# Patient Record
Sex: Male | Born: 1940 | Race: White | Hispanic: No | Marital: Married
Health system: Southern US, Community
[De-identification: ages and names within clinical notes are randomized; demographics above are authoritative.]

---

## 2004-08-01 ENCOUNTER — Emergency Department: Payer: Self-pay | Admitting: Emergency Medicine

## 2005-02-10 ENCOUNTER — Ambulatory Visit: Payer: Self-pay | Admitting: Gastroenterology

## 2007-03-14 ENCOUNTER — Emergency Department: Payer: Self-pay | Admitting: Emergency Medicine

## 2007-03-20 ENCOUNTER — Ambulatory Visit: Payer: Self-pay | Admitting: Orthopaedic Surgery

## 2007-03-20 ENCOUNTER — Other Ambulatory Visit: Payer: Self-pay

## 2011-08-24 ENCOUNTER — Emergency Department: Payer: Self-pay | Admitting: Unknown Physician Specialty

## 2012-09-04 LAB — CBC WITH DIFFERENTIAL/PLATELET
Basophil #: 0 10*3/uL (ref 0.0–0.1)
Basophil %: 0.3 %
Eosinophil #: 0 10*3/uL (ref 0.0–0.7)
Eosinophil %: 0.2 %
HCT: 25.5 % — ABNORMAL LOW (ref 40.0–52.0)
HGB: 8.7 g/dL — ABNORMAL LOW (ref 13.0–18.0)
Lymphocyte #: 0.2 10*3/uL — ABNORMAL LOW (ref 1.0–3.6)
Lymphocyte %: 3.2 %
MCH: 30.7 pg (ref 26.0–34.0)
MCV: 90 fL (ref 80–100)
Neutrophil #: 6.5 10*3/uL (ref 1.4–6.5)
Neutrophil %: 88.8 %
Platelet: 141 10*3/uL — ABNORMAL LOW (ref 150–440)
RBC: 2.83 10*6/uL — ABNORMAL LOW (ref 4.40–5.90)
RDW: 18 % — ABNORMAL HIGH (ref 11.5–14.5)

## 2012-09-04 LAB — COMPREHENSIVE METABOLIC PANEL
Albumin: 3.1 g/dL — ABNORMAL LOW (ref 3.4–5.0)
Alkaline Phosphatase: 128 U/L (ref 50–136)
Anion Gap: 9 (ref 7–16)
BUN: 21 mg/dL — ABNORMAL HIGH (ref 7–18)
Bilirubin,Total: 0.9 mg/dL (ref 0.2–1.0)
Calcium, Total: 9.1 mg/dL (ref 8.5–10.1)
Chloride: 107 mmol/L (ref 98–107)
Creatinine: 0.9 mg/dL (ref 0.60–1.30)
EGFR (African American): >60
EGFR (Non-African Amer.): >60
Potassium: 3.4 mmol/L — ABNORMAL LOW (ref 3.5–5.1)
SGPT (ALT): 27 U/L (ref 12–78)

## 2012-09-04 LAB — PROTIME-INR: INR: 1

## 2012-09-04 LAB — TROPONIN I: Troponin-I: <0.02 ng/mL

## 2012-09-05 ENCOUNTER — Inpatient Hospital Stay: Payer: Self-pay | Admitting: Internal Medicine

## 2012-09-05 LAB — URINALYSIS, COMPLETE
Blood: NEGATIVE
Glucose,UR: NEGATIVE mg/dL (ref 0–75)
Leukocyte Esterase: NEGATIVE
Nitrite: POSITIVE
Ph: 5 (ref 4.5–8.0)
Protein: NEGATIVE
RBC,UR: 2 /HPF (ref 0–5)
Specific Gravity: 1.02 (ref 1.003–1.030)
Squamous Epithelial: <1
WBC UR: 9 /HPF (ref 0–5)

## 2012-09-06 LAB — CBC WITH DIFFERENTIAL/PLATELET
Basophil #: 0 10*3/uL (ref 0.0–0.1)
Eosinophil %: 0.1 %
HCT: 19.5 % — ABNORMAL LOW (ref 40.0–52.0)
HGB: 6.9 g/dL — ABNORMAL LOW (ref 13.0–18.0)
Lymphocyte #: 0.4 10*3/uL — ABNORMAL LOW (ref 1.0–3.6)
MCH: 31.6 pg (ref 26.0–34.0)
Monocyte #: 0.6 x10 3/mm (ref 0.2–1.0)
Monocyte %: 10.8 %
Neutrophil %: 80.6 %
Platelet: 113 10*3/uL — ABNORMAL LOW (ref 150–440)
RDW: 18.2 % — ABNORMAL HIGH (ref 11.5–14.5)
WBC: 5.2 10*3/uL (ref 3.8–10.6)

## 2012-09-06 LAB — BASIC METABOLIC PANEL
Chloride: 107 mmol/L (ref 98–107)
Co2: 22 mmol/L (ref 21–32)
EGFR (African American): 54 — ABNORMAL LOW
EGFR (Non-African Amer.): 47 — ABNORMAL LOW
Glucose: 104 mg/dL — ABNORMAL HIGH (ref 65–99)
Potassium: 3.8 mmol/L (ref 3.5–5.1)
Sodium: 138 mmol/L (ref 136–145)

## 2012-09-07 LAB — CBC WITH DIFFERENTIAL/PLATELET
Comment - H1-Com2: NORMAL
HCT: 21.4 % — ABNORMAL LOW (ref 40.0–52.0)
HGB: 7.2 g/dL — ABNORMAL LOW (ref 13.0–18.0)
MCH: 30 pg (ref 26.0–34.0)
MCHC: 33.9 g/dL (ref 32.0–36.0)
MCV: 88 fL (ref 80–100)
Metamyelocyte: 1 %
Monocytes: 7 %
Myelocyte: 1 %
Segmented Neutrophils: 78 %

## 2012-09-07 LAB — BASIC METABOLIC PANEL
BUN: 23 mg/dL — ABNORMAL HIGH (ref 7–18)
Calcium, Total: 7.9 mg/dL — ABNORMAL LOW (ref 8.5–10.1)
Creatinine: 1.34 mg/dL — ABNORMAL HIGH (ref 0.60–1.30)
Osmolality: 280 (ref 275–301)
Potassium: 3.8 mmol/L (ref 3.5–5.1)
Sodium: 138 mmol/L (ref 136–145)

## 2012-09-08 LAB — CBC WITH DIFFERENTIAL/PLATELET
Comment - H1-Com2: NORMAL
HCT: 19.8 % — ABNORMAL LOW (ref 40.0–52.0)
Lymphocytes: 14 %
MCV: 89 fL (ref 80–100)
RBC: 2.24 10*6/uL — ABNORMAL LOW (ref 4.40–5.90)
RDW: 18.1 % — ABNORMAL HIGH (ref 11.5–14.5)
Segmented Neutrophils: 73 %
WBC: 4.6 10*3/uL (ref 3.8–10.6)

## 2012-09-08 LAB — BASIC METABOLIC PANEL
BUN: 19 mg/dL — ABNORMAL HIGH (ref 7–18)
Calcium, Total: 7.8 mg/dL — ABNORMAL LOW (ref 8.5–10.1)
Co2: 23 mmol/L (ref 21–32)
Creatinine: 1.08 mg/dL (ref 0.60–1.30)
EGFR (African American): >60
EGFR (Non-African Amer.): >60
Glucose: 99 mg/dL (ref 65–99)
Osmolality: 276 (ref 275–301)
Potassium: 4 mmol/L (ref 3.5–5.1)

## 2012-09-08 LAB — VANCOMYCIN, TROUGH: Vancomycin, Trough: 15 ug/mL (ref 10–20)

## 2013-03-10 DEATH — deceased

## 2014-05-02 NOTE — Consult Note (Signed)
PATIENT NAME:  Steve Shaw, Steve Shaw MR#:  [id number] DATE OF BIRTH:  May 01, 1940  DATE OF CONSULTATION:  09/05/2012  REFERRING PHYSICIAN: Einar CrowMarshall Anderson, MD  CONSULTING PHYSICIAN:  Leitha SchullerMichael J. Gladys Gutman, MD  REASON FOR CONSULTATION: Severe right hip pain.   HISTORY OF PRESENT ILLNESS: The patient is a 74 year old with known metastatic prostate CA, who had fairly rapid onset of hip pain in the right hip. He had significant fever and there was concern about possible septic joint. On examination, he is found to be somewhat tender over the greater trochanter and proximal to it. He did not have pain with leg log rolling. He is unable to bear weight on initial exam. He is neurovascularly intact. He had been continuing to work up until the onset of this. 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 On MRI, there was also a hematoma in the gluteal muscle. This appears to be the cause of his pain. With absence of pain with logrolling, I doubt there is any intra-articular pathology other than his metastatic disease, which does not seem to be giving him and his pain with the pain being more proximal.   IMPRESSION: Soft tissue injury right gluteal musculature. Recommend physical therapy to work on weight-bearing as tolerated.  ____________________________ Leitha SchullerMichael J. Shaterrica Territo, MD mjm:aw D: 09/06/2012 07:33:00 ET T: 09/06/2012 07:45:15 ET JOB#: [id number]  cc: Leitha SchullerMichael J. Makyiah Lie, MD, <Dictator> Leitha SchullerMICHAEL J Nikyla Navedo MD ELECTRONICALLY SIGNED 09/06/2012 9:11

## 2014-05-02 NOTE — Consult Note (Signed)
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   Patient was seen by consultant.   Comments: recommend PT, WBAT on right modalities to right hip.  Electronic Signatures: Leitha SchullerMenz, Lavora Brisbon J (MD)  (Signed 27-Aug-14 12:50)  Authored: Brief Consult Note   Last Updated: 27-Aug-14 12:50 by Leitha SchullerMenz, Manuella Blackson J (MD)

## 2014-05-02 NOTE — Discharge Summary (Signed)
PATIENT NAME:  Steve Shaw, Steve Shaw MR#:  [id number] DATE OF BIRTH:  1940/10/18  DATE OF ADMISSION:  09/05/2012 DATE OF DISCHARGE:  09/06/2012  DISCHARGE DIAGNOSES: 1.  Sepsis.  2.  Metastatic prostate cancer.  3.  Anemia, likely from chemotherapy.  CURRENT MEDICATIONS: That might have to be changed at transfer center are: 1.  Tylenol p.r.n.  2.  Lovenox 95 mg q. 12 hours, which has been chronic for his pulmonary emboli, [facility]. 3.  Levofloxacin 750 mg IV q. 24 hours. 4.  Morphine 2 to 4 mg q. 4 hours p.r.n. pain. 5.  Zofran 4 mg p.o. q. 6 hours p.r.n. nausea and vomiting. 6.  Ranitidine 150 mg p.o. q. 12 hours. 7.  Vancomycin 1000 mg q. 12 hours. Apt to change based on levels.  HOME MEDICATIONS: That were held at admission include: Naprosyn p.r.n., prednisone and dexamethasone which have been used as part of his chemotherapy regimen, Zyrtec 10 mg daily, calcium and vitamin D daily, atorvastatin 10 mg daily.   HISTORY AND PHYSICAL: Please see detailed history and physical done on admission.   HOSPITAL COURSE: The patient was admitted with pain, weakness and fever of 103. Fever mostly abated on the antibiotics as noted above, although he was 97.7 this morning, 98 levels otherwise. Cultures are still negative today including blood cultures. Urinalysis had some pyuria, but a mild amount. Culture is pending. Further work-up of his hip pain included a MRI of his hip, which was significant for right gluteal muscle fluid collection, which is felt to be most consistent with a hematoma given his heterogeneous T1 and T2 signal noise. Did have a hematoma visually superficially on the right hip area as well. He had signal abnormality throughout the lower lumbar spine, sacrum, ilium and bilateral proximal femurs most consistent with metastatic disease. Hemoglobin on admission was 8.7, but after a day it was down to 6.9. He will likely need transfusion for that. Will hold off on ordering that until we  can get a better time frame and answer on potential transfer. His other labs showed a creatinine of 1.5 today, WBC of 5.2 with a hemoglobin of 6.9 and platelet count of 113.  ____________________________ Marya AmslerMarshall W. Dareen PianoAnderson, MD mwa:sb D: 09/06/2012 08:13:11 ET T: 09/06/2012 08:28:36 ET JOB#: [id number]  cc: Marya AmslerMarshall W. Dareen PianoAnderson, MD, <Dictator> Lauro RegulusMARSHALL W Matvey Llanas MD ELECTRONICALLY SIGNED 09/07/2012 7:56

## 2014-05-02 NOTE — Discharge Summary (Signed)
PATIENT NAME:  Steve FredericksonWALSTON, Dolton MR#:  [id number] DATE OF BIRTH:  1940/09/05  DATE OF ADMISSION:  09/05/2012 DATE OF DISCHARGE:  09/07/2012   Addendum  ADDENDUM TO HOSPITAL COURSE: The patient remained stable overnight. Hemoglobin came up to 7.2 after transfusion. However, this morning he is complaining of weaker legs with some numb sensation as well. 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 Will go ahead and order an MRI of his L-spine prior [facility], if this can be done. Please see report of MRI for details if that is done. The patient understands this and understands [facility] is [facility]. He will be transferred to where his treating oncologists are at this point. He is still on his antibiotics and remains afebrile. Urine cultures have gram-negative rods of just 1000 colonies/mL. His temperature has remained under 100 at this point as well, so thought to discontinuing his vancomycin and levofloxacin will be given in the next 24 hours either here or at [facility].   ____________________________ Marya AmslerMarshall W. Dareen PianoAnderson, MD mwa:OSi D: 09/07/2012 08:08:54 ET T: 09/07/2012 08:32:33 ET JOB#: [id number]  cc: Marya AmslerMarshall W. Dareen PianoAnderson, MD, <Dictator> Lauro RegulusMARSHALL W Shanie Mauzy MD ELECTRONICALLY SIGNED 09/07/2012 13:46

## 2014-05-02 NOTE — H&P (Signed)
PATIENT NAME:  Steve Shaw, Steve Shaw MR#:  [id number] DATE OF BIRTH:  1940/04/02  DATE OF ADMISSION:  09/05/2012  PRIMARY CARE PHYSICIAN:  Dr. Dareen Piano.  REFERRING PHYSICIAN:  Dr. Fanny Bien.  CHIEF COMPLAINT:  Fever and right hip pain.   HISTORY OF PRESENT ILLNESS:  The patient is a 74 year old Caucasian male with past medical history of metastatic prostate cancer with mets to right hip as per recent PET scan, on chemotherapy, last chemo on August 11th, is presenting to the ER with a chief complaint of fever of 103 degrees Fahrenheit associated with weakness and severe right hip pain and unable to ambulate.  The patient denies any chest pain or shortness of breath.  He gets his chemotherapies at [facility].  The patient also has recent history of 2 pulmonary embolism and DVT and gets Lovenox 1 mg/kg subQ q. 12 hours.  Initially the patient was hypotensive.  After giving fluid boluses his blood pressure went up to 102/57.  Chest x-ray has revealed a possible developing pneumonia on the right lower lobe.  Blood cultures were obtained and Zosyn and vancomycin were given empirically.  Hospitalist team is called to admit the patient for sepsis.  During my examination, the patient denies any chest pain or shortness of breath.  The pain medication has eased off his right hip pain.  Wife and sister-in-law at bedside.   PAST MEDICAL HISTORY:  Metastatic prostate cancer, hyperlipidemia, chronic sinusitis.   PAST SURGICAL HISTORY:  Left knee cap surgery, prostatectomy, hernia repair.   ALLERGIES:  The patient has no known drug allergies.   PSYCHOSOCIAL HISTORY:  Lives at home with wife.  Denies smoking.  Occasional intake of alcohol.  Denies drugs.   FAMILY HISTORY:  Both parents were healthy.  Brother had a history of prostate cancer.   HOME MEDICATIONS:  Tylenol Arthritis 1 tablet once a day, Lipitor 10 mg once daily, ibuprofen 800 mg 3 times a day, Claritin 10 mg 2 times a day, aspirin 81 mg once daily, Tylenol 1 tablet  by mouth q. 8 hours as needed.   REVIEW OF SYSTEMS: CONSTITUTIONAL:  Denies any weight loss, but complaining of fever and fatigue.  EYES:  No blurry vision, glaucoma.  EARS, NOSE, THROAT:  No epistaxis or discharge.  RESPIRATION:  Denies cough, COPD.  CARDIOVASCULAR:  No chest pain or palpitations.  GASTROINTESTINAL:  Denies nausea, vomiting, diarrhea.  GENITOURINARY:  No dysuria, hematuria.  Had a history of hernia repair.  Had a history of metastatic prostate cancer with mets to right hip.   ENDOCRINE:  Denies polyuria, nocturia, thyroid problems. HEMATOLOGIC:  Denies anemia, easy bruising or bleeding, but had a recent history of PE and DVT.  MUSCULOSKELETAL:  Complaining of right hip pain, was able to ambulate for the past two weeks, but unable to ambulate for the past one day.  NEUROLOGIC:  No vertigo, ataxia.   PSYCHIATRIC:  No ADD, OCD.   PHYSICAL EXAMINATION: VITAL SIGNS:  Temperature 99 degrees Fahrenheit, pulse 104, respirations 18, blood pressure 102/57, pulse ox 97% on 2 liters.  GENERAL APPEARANCE:  Not under acute distress.  Moderately built and nourished.  HEENT:  Normocephalic, atraumatic.  Pupils are equally reacting to light and accommodation.  No scleral icterus.  No conjunctival injection.  No sinus tenderness.  No postnasal drip.  NECK:  Supple.  No JVD.  No thyromegaly.  Range of motion is intact.  LUNGS:  Clear to auscultation bilaterally except decreased breath sounds and minimal crackles at the right lower  lobe.  CARDIAC:  S1, S2 normal.  Regular rate and rhythm.  No murmurs.  GASTROINTESTINAL:  Soft.  Bowel sounds are positive in all four quadrants.  Nontender, nondistended.  No hepatosplenomegaly.  NEUROLOGIC:  Awake, alert, oriented x 3.  Motor and sensory grossly intact.  Reflexes are 2+.  EXTREMITIES:  No edema.  No cyanosis.  Right hip is tender.  Range of motion is limited.  SKIN:  Warm to touch.  Normal turgor.  No rashes.  No lesions.  PSYCHIATRIC:  Normal  mood and affect.   LABORATORY DATA AND IMAGING STUDIES:  EKG, normal sinus rhythm.  No acute ST-T wave changes.  Glucose 130, BUN 21, creatinine 0.90, sodium 138, potassium 3.4, chloride 107, CO2 22, GFR greater than 60.  Anion gap 9, serum osmolality 280.  Calcium 9.1, phosphorous 1.6.  Magnesium 1.7, troponin less than 0.02.  WBC 7.3, hemoglobin 8.7, hematocrit 25.5, platelets 141.  LFTs, total protein 6.2, albumin 3.1, bili total 0.9, alkaline phosphatase 128, AST 150, ALT 27, PH 13.8, INR 1.0.  Venous gas shows pH of 7.38, pCO2 41.  Lactic acid 1.8.  Chest x-ray, possible developing infiltrate in the right lower lobe.   ASSESSMENT AND PLAN:  A 74 year old Caucasian male presenting to the ER with a chief complaint of fever and right hip pain will be admitted with the following assessment and plan.  1.  Sepsis, probably developing lower lobe pneumonia.  Other possibilities are septic hip, urinalysis is still pending at this time.  Pan cultures were obtained.  We will put him on empiric antibiotics Zosyn and Levaquin.  We will obtain MRI of the hip.  Morphine as needed for hip pain.  Bedrest with nonweightbearing.  Ortho consult is placed to Dr. Hyacinth MeekerMiller.    2.  History of metastatic prostate cancer.  The [facility].  We will obtain medical records from [facility] in a.m.  3.  Hypomagnesemia.  Replete and check magnesium in the a.m.  4.  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  We will continue that.  We will closely monitor platelet count, which is on the low-normal side.  5.  HE IS FULL CODE.  Wife is medical power of attorney.   Diagnosis and plan of care was discussed in detail with the patient and his wife at bedside.  Will turnover patient to Dr. Dareen PianoAnderson in the a.m.   Total time spent on admission is 50 minutes.     ____________________________ Ramonita LabAruna Payam Gribble, MD ag:ea D: 09/05/2012 01:54:57  ET T: 09/05/2012 02:38:17 ET JOB#: [id number]  cc: Ramonita LabAruna Abdulrahim Siddiqi, MD, <Dictator> Ramonita LabARUNA Gabreil [place] MD ELECTRONICALLY SIGNED 09/14/2012 2:21

## 2015-05-21 IMAGING — CR RIGHT HIP - COMPLETE 2+ VIEW
1 series · 2 of 2 positions shown · non-contrast
Comparison: none

REASON FOR EXAM: hip pain, h/o metastatic prostate disease
COMMENTS:

PROCEDURE:     DXR - DXR HIP RIGHT COMPLETE  - September 05, 2012 [DATE]
RESULT:     There is no evidence of fracture, dislocation, or malalignment.

[Series 1: t hip ap right · 0.14mm/px · 2 of 2 slices shown]
[im 1/2]
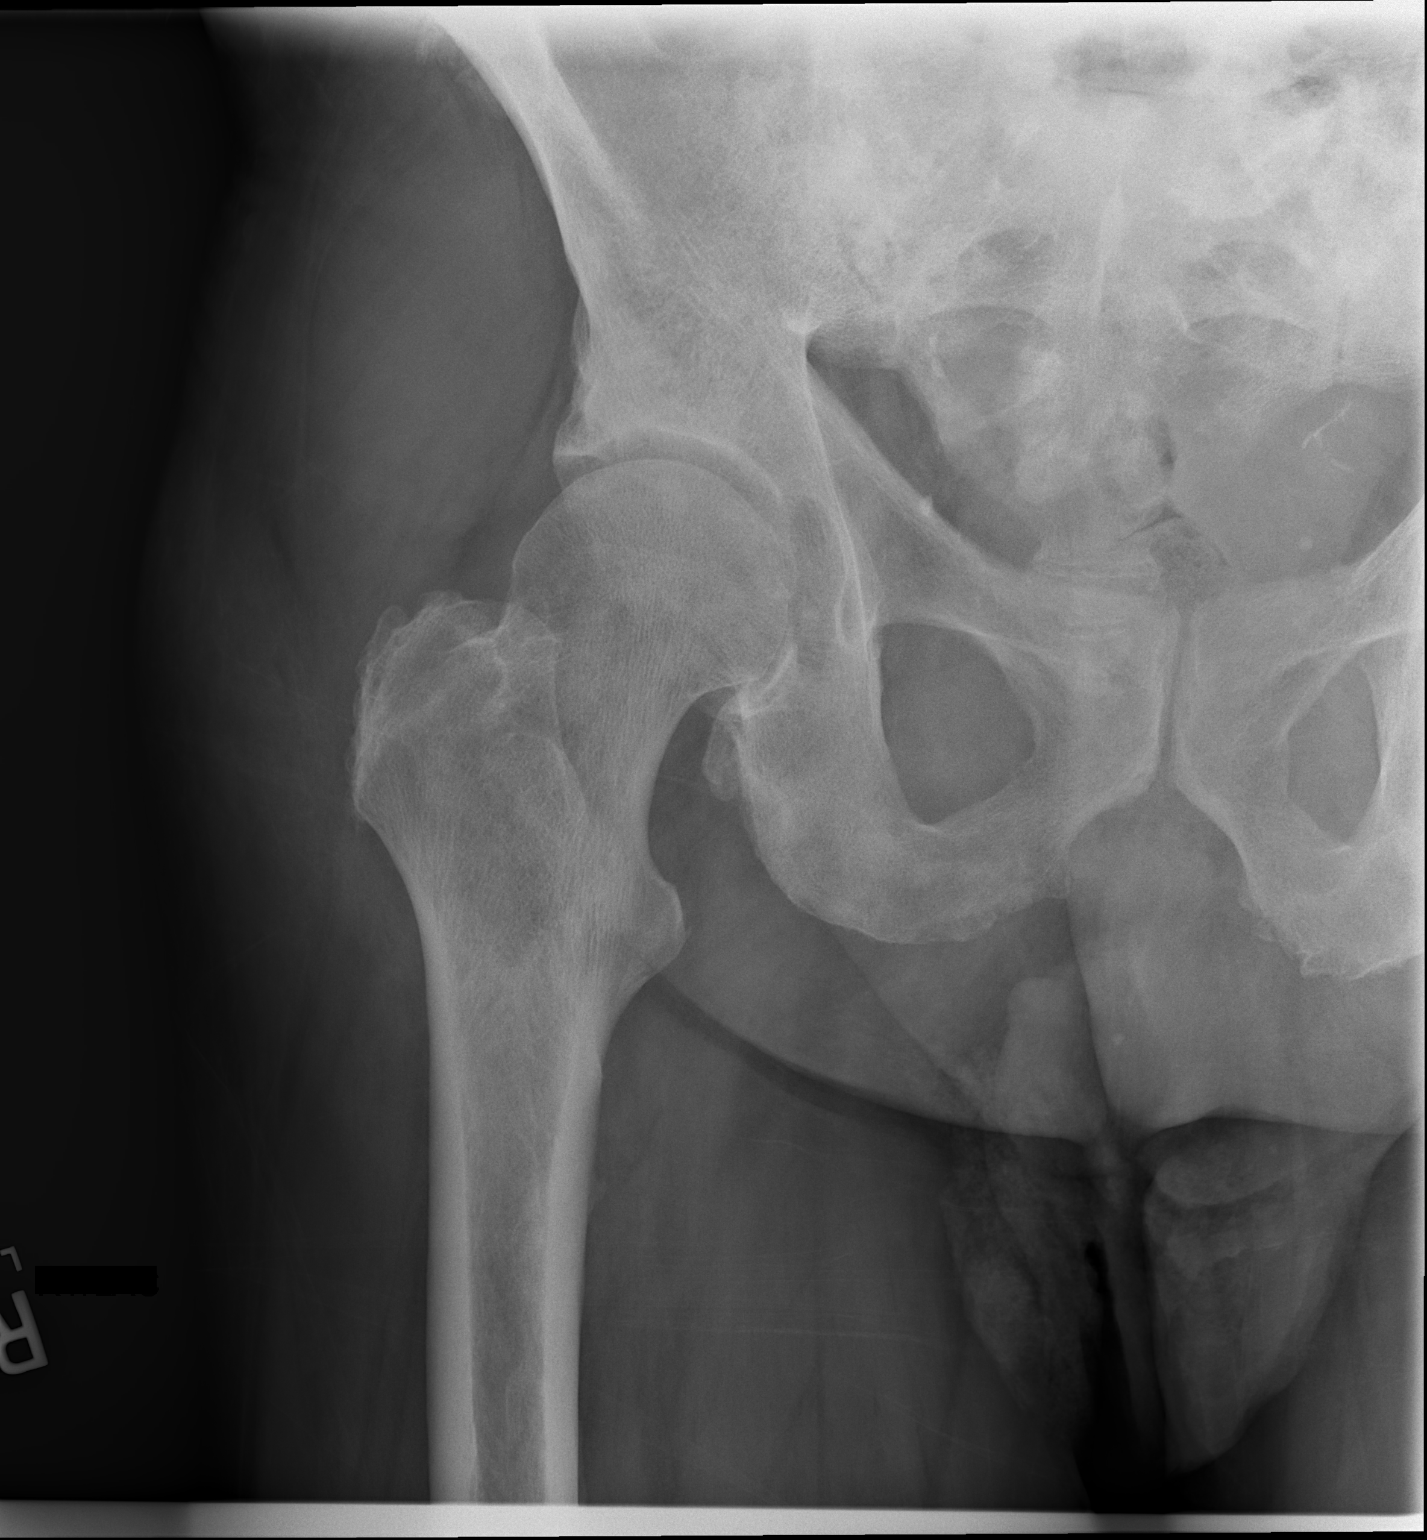
[im 2/2]
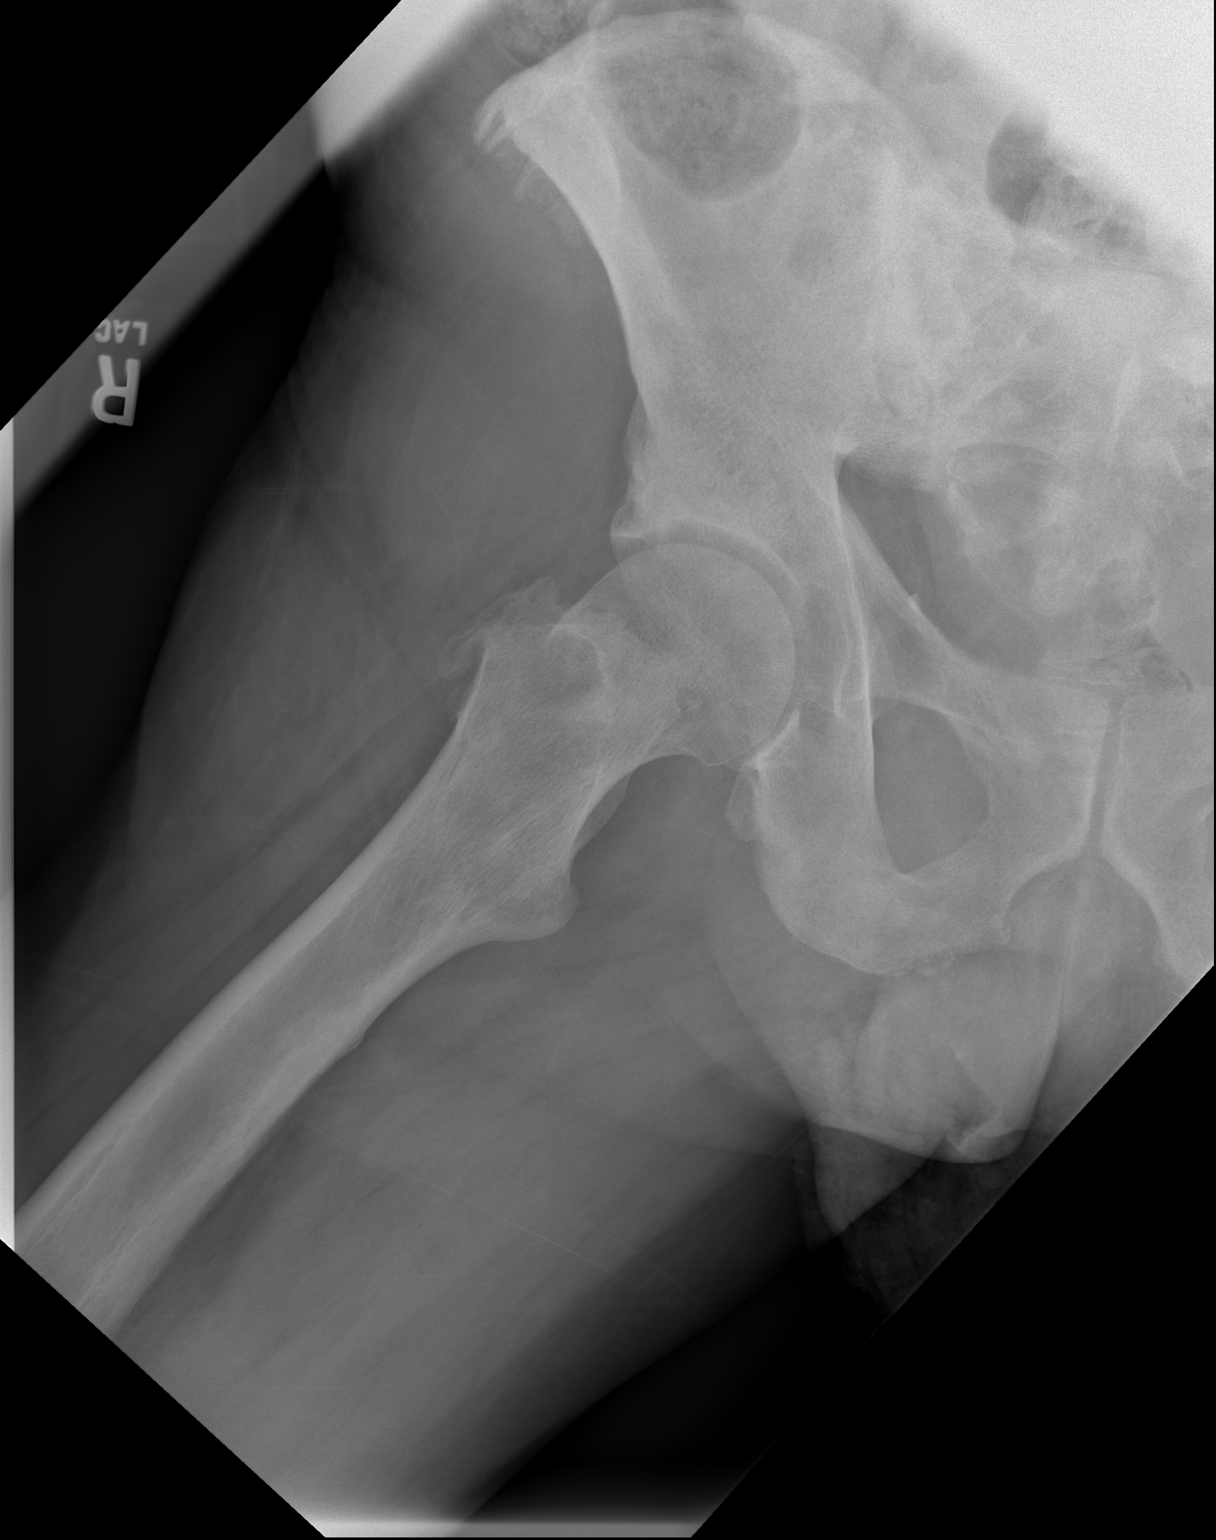

[2 of 2 positions shown; findings below may reference images not displayed]

IMPRESSION: 1. No evidence of acute abnormalities.
2. If there are persistent complaints of pain or persistent clinical
concern, a repeat evaluation in 7-10 days is recommended or further
evaluation with MRI if clinically warranted.
